# Patient Record
Sex: Male | Born: 2000 | Race: White | Hispanic: No | Marital: Single | Smoking: Never smoker
Health system: Southern US, Community
[De-identification: ages and names within clinical notes are randomized; demographics above are authoritative.]

## PROBLEM LIST (undated history)

## (undated) DIAGNOSIS — R12 Heartburn: Secondary | ICD-10-CM

## (undated) DIAGNOSIS — J45990 Exercise induced bronchospasm: Secondary | ICD-10-CM

## (undated) DIAGNOSIS — E781 Pure hyperglyceridemia: Secondary | ICD-10-CM

## (undated) DIAGNOSIS — I10 Essential (primary) hypertension: Secondary | ICD-10-CM

## (undated) HISTORY — DX: Pure hyperglyceridemia: E78.1

## (undated) HISTORY — DX: Exercise induced bronchospasm: J45.990

## (undated) HISTORY — DX: Heartburn: R12

---
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
## 2021-01-02 ENCOUNTER — Other Ambulatory Visit: Payer: Self-pay | Admitting: Family Medicine

## 2021-01-02 DIAGNOSIS — F418 Other specified anxiety disorders: Secondary | ICD-10-CM

## 2021-01-02 NOTE — Telephone Encounter (Signed)
Requested Prescriptions  Pending Prescriptions Disp Refills  . sertraline (ZOLOFT) 50 MG tablet [Pharmacy Med Name: Sertraline HCl 50 MG Oral Tablet] 90 tablet 1    Sig: TAKE 1 TABLET BY MOUTH  DAILY     Psychiatry:  Antidepressants - SSRI Passed - 01/02/2021  9:27 PM      Passed - Completed PHQ-2 or PHQ-9 in the last 360 days      Passed - Valid encounter within last 6 months    Recent Outpatient Visits          1 month ago Acute frontal sinusitis, recurrence not specified   [facility] Malva Limes, MD   10 months ago Depression with anxiety   [facility] Malva Limes, MD   10 months ago Right acute serous otitis media, recurrence not specified   [facility] Osvaldo Angst M, PA-C   10 months ago Acute otitis externa of left ear, unspecified type   [facility] [place], [place], [place]   2 years ago Cough   [facility] Malva Limes, MD

## 2021-02-10 ENCOUNTER — Ambulatory Visit: Payer: Self-pay | Admitting: *Deleted

## 2021-02-10 DIAGNOSIS — U071 COVID-19: Secondary | ICD-10-CM

## 2021-02-10 NOTE — Telephone Encounter (Signed)
Referral placed to covid treatment clinic

## 2021-02-10 NOTE — Telephone Encounter (Signed)
Can you find out if and when he received covid vaccines?

## 2021-02-10 NOTE — Telephone Encounter (Signed)
He has not been vaccinated per [facility].

## 2021-02-10 NOTE — Telephone Encounter (Signed)
I returned call to mother Beatrice Sehgal she is on his DPR.   His covid home test is positive and she was wanting to know what are the next steps as far as quarantine, etc.   She reports he has symptoms of fever, coughing, very sore throat, headache, fatigue, and body aches.  Criteria for self-isolation if you test positive for COVID-19, regardless of vaccination status:  -If you have mild symptoms that are resolving or have resolved, isolate at home for 5 days since symptoms started AND continue to wear a well-fitted mask when around others in the home and in public for 5 additional days after isolation is completed -If you have a fever and/or moderate to severe symptoms, isolate for at least 10 days since the symptoms started AND until you are fever free for at least 24 hours without the use of fever-reducing medications -If you tested positive and did not have symptoms, isolate for at least 5 days after your positive test  Use over-the-counter medications for symptoms.If you develop respiratory issues/distress, seek medical care in the Emergency Department.  If you must leave home or if you have to be around others please wear a mask. Please limit contact with immediate family members in the home, practice social distancing, frequent handwashing and clean hard surfaces touched frequently with household cleaning products. Members of your household will also need to quarantine and test.You may also be contacted by the health department for follow up. [facility] Department notified.   I answered her questions.  I send my notes to [facility] so Dr. Caryn Section would be aware in case treatment is indicated due to his risk factor.   Reason for Disposition . [1] [id number] diagnosed by positive lab test (e.g., PCR, rapid self-test kit) AND [2] mild symptoms (e.g., cough, fever, others) AND [8] no complications or SOB  Answer Assessment - Initial Assessment Questions 1. COVID-19  DIAGNOSIS: "Who made your COVID-19 diagnosis?" "Was it confirmed by a positive lab test or self-test?" If not diagnosed by a doctor (or NP/PA), ask "Are there lots of cases (community spread) where you live?" Note: See public health department website, if unsure.     Home test positive 2. COVID-19 EXPOSURE: "Was there any known exposure to COVID before the symptoms began?" CDC Definition of close contact: within 6 feet (2 meters) for a total of 15 minutes or more over a 24-hour period.      Sat. At 2:00 AM woke up sick with most of the symptoms 3. ONSET: "When did the COVID-19 symptoms start?"      Sat. At 2:00 AM he woke up sick 4. WORST SYMPTOM: "What is your worst symptom?" (e.g., cough, fever, shortness of breath, muscle aches)     Fever   Using Tylenol which is helping 5. COUGH: "Do you have a cough?" If Yes, ask: "How bad is the cough?"       Yes 6. FEVER: "Do you have a fever?" If Yes, ask: "What is your temperature, how was it measured, and when did it start?"     Yes 102.5 but now down to 100 with Tylenol 7. RESPIRATORY STATUS: "Describe your breathing?" (e.g., shortness of breath, wheezing, unable to speak)      Keeping a close eye on his breathing since he is "a big boy" per his mother Tabatha. 8. BETTER-SAME-WORSE: "Are you getting better, staying the same or getting worse compared to yesterday?"  If getting worse, ask, "In what way?"  Getting better since Sat. 9. HIGH RISK DISEASE: "Do you have any chronic medical problems?" (e.g., asthma, heart or lung disease, weak immune system, obesity, etc.)     "He is a big boy" 10. VACCINE: "Have you had the COVID-19 vaccine?" If Yes, ask: "Which one, how many shots, when did you get it?"       No vaccine 11. BOOSTER: "Have you received your COVID-19 booster?" If Yes, ask: "Which one and when did you get it?"       No 12. PREGNANCY: "Is there any chance you are pregnant?" "When was your last menstrual period?"       N/A 13. OTHER  SYMPTOMS: "Do you have any other symptoms?"  (e.g., chills, fatigue, headache, loss of smell or taste, muscle pain, sore throat)       Fatigue, headache, body aches, coughing, sore throat 14. O2 SATURATION MONITOR:  "Do you use an oxygen saturation monitor (pulse oximeter) at home?" If Yes, ask "What is your reading (oxygen level) today?" "What is your usual oxygen saturation reading?" (e.g., 95%)       No  Protocols used: CORONAVIRUS (COVID-19) DIAGNOSED OR SUSPECTED-A-AH

## 2021-02-10 NOTE — Addendum Note (Signed)
Addended by: Malva Limes on: 02/10/2021 04:55 PM   Modules accepted: Orders

## 2021-02-11 ENCOUNTER — Encounter: Payer: Self-pay | Admitting: Adult Health

## 2021-02-11 ENCOUNTER — Telehealth: Payer: Self-pay | Admitting: Adult Health

## 2021-02-11 NOTE — Telephone Encounter (Signed)
Called to discuss with patient about COVID-19 symptoms and the use of one of the available treatments for those with mild to moderate Covid symptoms and at a high risk of hospitalization.  Pt appears to qualify for outpatient treatment due to co-morbid conditions and/or a member of an at-risk group in accordance with the FDA Emergency Use Authorization.    Symptom onset: 02/08/2021 Vaccinated: no Booster? no Immunocompromised? no Qualifiers: BMI, obesity, chronic lung disease NIH Criteria: 1  Would be candidate for Bebtelovimab given comorbidities and being unvaccinated.  Unable to reach pt - LMOM, my chart message sent   Noreene Filbert

## 2021-03-24 ENCOUNTER — Other Ambulatory Visit: Payer: Self-pay | Admitting: Family Medicine

## 2021-03-24 DIAGNOSIS — I10 Essential (primary) hypertension: Secondary | ICD-10-CM

## 2021-03-24 DIAGNOSIS — K219 Gastro-esophageal reflux disease without esophagitis: Secondary | ICD-10-CM

## 2021-03-25 NOTE — Telephone Encounter (Signed)
Have sent 90 day refill to his pharmacy, but he is due for follow up office visit and needs to schedule in the next month or two.

## 2021-10-23 ENCOUNTER — Other Ambulatory Visit: Payer: Self-pay | Admitting: Family Medicine

## 2021-10-23 DIAGNOSIS — I10 Essential (primary) hypertension: Secondary | ICD-10-CM

## 2021-10-23 DIAGNOSIS — K219 Gastro-esophageal reflux disease without esophagitis: Secondary | ICD-10-CM

## 2021-10-23 DIAGNOSIS — F418 Other specified anxiety disorders: Secondary | ICD-10-CM

## 2021-10-23 MED ORDER — SERTRALINE HCL 50 MG PO TABS: 50.0000 mg | ORAL_TABLET | Freq: Every day | ORAL | 0 refills | Status: DC

## 2021-10-23 MED ORDER — AMLODIPINE BESYLATE 10 MG PO TABS: 10.0000 mg | ORAL_TABLET | Freq: Every day | ORAL | 0 refills | Status: DC

## 2021-10-23 NOTE — Telephone Encounter (Signed)
Already had curtesy refill, NO SHOW 09/11/20, no upcoming appt scheduled. Requested Prescriptions  Pending Prescriptions Disp Refills   omeprazole (PRILOSEC) 20 MG capsule [Pharmacy Med Name: Omeprazole 20 MG Oral Capsule Delayed Release] 90 capsule 3    Sig: TAKE 1 CAPSULE BY MOUTH  DAILY     Gastroenterology: Proton Pump Inhibitors Passed - 10/23/2021  4:43 AM      Passed - Valid encounter within last 12 months    Recent Outpatient Visits          11 months ago Acute frontal sinusitis, recurrence not specified   [facility] Malva Limes, MD   1 year ago Depression with anxiety   [facility] Malva Limes, MD   1 year ago Right acute serous otitis media, recurrence not specified   [facility] [place], Adriana M, PA-C   1 year ago Acute otitis externa of left ear, unspecified type   [facility] Osvaldo Angst M, [place]   3 years ago Cough   [facility] Malva Limes, MD              amLODipine (NORVASC) 10 MG tablet [Pharmacy Med Name: amLODIPine Besylate 10 MG Oral Tablet] 90 tablet 3    Sig: TAKE 1 TABLET BY MOUTH  DAILY     Cardiovascular:  Calcium Channel Blockers Failed - 10/23/2021  4:43 AM      Failed - Valid encounter within last 6 months    Recent Outpatient Visits          11 months ago Acute frontal sinusitis, recurrence not specified   [facility] Malva Limes, MD   1 year ago Depression with anxiety   [facility] Malva Limes, MD   1 year ago Right acute serous otitis media, recurrence not specified   [facility] [place], Adriana M, PA-C   1 year ago Acute otitis externa of left ear, unspecified type   [facility] Osvaldo Angst M, [place]   3 years ago Cough   [facility] Malva Limes, MD             Passed - Last BP in normal range    BP Readings from Last 1 Encounters:  03/05/20 139/82           sertraline (ZOLOFT) 50 MG tablet [Pharmacy Med Name: Sertraline HCl 50 MG Oral Tablet] 90 tablet 3    Sig: TAKE 1 TABLET BY MOUTH  DAILY     Psychiatry:  Antidepressants - SSRI Failed - 10/23/2021  4:43 AM      Failed - Completed PHQ-2 or PHQ-9 in the last 360 days      Failed - Valid encounter within last 6 months    Recent Outpatient Visits          11 months ago Acute frontal sinusitis, recurrence not specified   [facility] Malva Limes, MD   1 year ago Depression with anxiety   [facility] Malva Limes, MD   1 year ago Right acute serous otitis media, recurrence not specified   [facility] [place], Lavella Hammock, PA-C   1 year ago Acute otitis externa of left ear, unspecified type   [facility], [place], [place]   3 years ago Cough   [facility] Malva Limes, MD

## 2021-10-27 ENCOUNTER — Encounter: Payer: Self-pay | Admitting: Physician Assistant

## 2021-10-27 ENCOUNTER — Ambulatory Visit: Payer: Managed Care, Other (non HMO) | Admitting: Physician Assistant

## 2021-10-27 ENCOUNTER — Other Ambulatory Visit: Payer: Self-pay

## 2021-10-27 VITALS — BP 163/93 | HR 94 | Ht 72.0 in | Wt >= 6400 oz

## 2021-10-27 DIAGNOSIS — J029 Acute pharyngitis, unspecified: Secondary | ICD-10-CM | POA: Diagnosis not present

## 2021-10-27 DIAGNOSIS — R0981 Nasal congestion: Secondary | ICD-10-CM | POA: Diagnosis not present

## 2021-10-27 DIAGNOSIS — J0110 Acute frontal sinusitis, unspecified: Secondary | ICD-10-CM | POA: Diagnosis not present

## 2021-10-27 DIAGNOSIS — H66002 Acute suppurative otitis media without spontaneous rupture of ear drum, left ear: Secondary | ICD-10-CM

## 2021-10-27 LAB — POCT RAPID STREP A (OFFICE): Rapid Strep A Screen: NEGATIVE

## 2021-10-27 MED ORDER — AZELASTINE HCL 0.1 % NA SOLN: 2.0000 | Freq: Two times a day (BID) | NASAL | 2 refills | Status: AC

## 2021-10-27 MED ORDER — AMOXICILLIN-POT CLAVULANATE 875-125 MG PO TABS: 1.0000 | ORAL_TABLET | Freq: Two times a day (BID) | ORAL | 0 refills | Status: AC

## 2021-10-27 NOTE — Progress Notes (Signed)
I,Kevin Calderon,acting as a Neurosurgeon for [employer], PA-C.,have documented all relevant documentation on the behalf of Kevin Ferguson, PA-C,as directed by  Kevin Ferguson, PA-C while in the presence of Kevin Ferguson, PA-C.  Established patient visit   Patient: Kevin Calderon   DOB: 2000-11-26   20 y.o. Male  MRN: [id number] Visit Date: 10/27/2021  Today's healthcare provider: Alfredia Ferguson, PA-C   Cc. Sore throat, nasal congestion  Subjective    HPI  Kevin Calderon is a 21 y/o male who presents today with a sore throat, runny nose, PND, left ear decreased hearing since Friday. Reports negative COVID test this morning. Denies chest pain, SOB.  H/o tonsil and adenoidectomy    Medications: Outpatient Medications Prior to Visit  Medication Sig   albuterol (PROVENTIL HFA;VENTOLIN HFA) 108 (90 BASE) MCG/ACT inhaler Inhale 2 puffs into the lungs as needed.   amLODipine (NORVASC) 10 MG tablet Take 1 tablet (10 mg total) by mouth daily.   omeprazole (PRILOSEC) 20 MG capsule TAKE 1 CAPSULE BY MOUTH  DAILY   sertraline (ZOLOFT) 50 MG tablet Take 1 tablet (50 mg total) by mouth daily.   No facility-administered medications prior to visit.    Review of Systems  Constitutional:  Positive for fatigue. Negative for fever.  HENT:  Positive for congestion, hearing loss, postnasal drip, rhinorrhea, sinus pressure and sore throat.   Respiratory:  Positive for cough. Negative for shortness of breath.   Cardiovascular:  Negative for chest pain and leg swelling.  Neurological:  Negative for dizziness.      Objective    Blood pressure (!) 163/93, pulse 94, height 6' (1.829 m), weight (!) 417 lb 9.6 oz (189.4 kg), SpO2 99 %.   Physical Exam Constitutional:      General: He is awake.     Appearance: He is well-developed.  HENT:     Head: Normocephalic.     Right Ear: Tympanic membrane normal.     Left Ear: Tympanic membrane is erythematous and bulging.     Nose: Congestion and  rhinorrhea present.     Mouth/Throat:     Pharynx: Posterior oropharyngeal erythema present.  Eyes:     Conjunctiva/sclera: Conjunctivae normal.  Cardiovascular:     Rate and Rhythm: Normal rate.  Pulmonary:     Effort: Pulmonary effort is normal.  Skin:    General: Skin is warm.  Neurological:     Mental Status: He is alert and oriented to person, place, and time.  Psychiatric:        Attention and Perception: Attention normal.        Mood and Affect: Mood normal.        Speech: Speech normal.        Behavior: Behavior is cooperative.    Results for orders placed or performed in visit on 10/27/21  POCT rapid strep A  Result Value Ref Range   Rapid Strep A Screen Negative Negative    Assessment & Plan     Otitis Media, left ear Rx augmentin Advised azelastine spray, saline nasal spray Advised dayquil/nyquil  Monitor for fevers  2. Acute sinusitis, viral Strep negative See above   Return if symptoms worsen or fail to improve.      I, Kevin Ferguson, PA-C have reviewed all documentation for this visit. The documentation on  10/27/2021 for the exam, diagnosis, procedures, and orders are all accurate and complete.    Kevin Ferguson, PA-C  [facility] [phone] (phone) [phone] (fax)  [facility] ° °

## 2022-03-16 ENCOUNTER — Encounter: Payer: Self-pay | Admitting: Physician Assistant

## 2022-06-29 ENCOUNTER — Ambulatory Visit
Admission: EM | Admit: 2022-06-29 | Discharge: 2022-06-29 | Disposition: A | Discharge status: Home / Self Care | Payer: Managed Care, Other (non HMO) | Attending: Emergency Medicine | Admitting: Emergency Medicine

## 2022-06-29 DIAGNOSIS — R509 Fever, unspecified: Secondary | ICD-10-CM | POA: Insufficient documentation

## 2022-06-29 DIAGNOSIS — I10 Essential (primary) hypertension: Secondary | ICD-10-CM | POA: Insufficient documentation

## 2022-06-29 DIAGNOSIS — J45901 Unspecified asthma with (acute) exacerbation: Secondary | ICD-10-CM | POA: Insufficient documentation

## 2022-06-29 DIAGNOSIS — Z20822 Contact with and (suspected) exposure to covid-19: Secondary | ICD-10-CM | POA: Insufficient documentation

## 2022-06-29 LAB — RESP PANEL BY RT-PCR (FLU A&B, COVID) ARPGX2
Influenza A by PCR: NEGATIVE
Influenza B by PCR: NEGATIVE
SARS Coronavirus 2 by RT PCR: NEGATIVE

## 2022-06-29 MED ORDER — PREDNISONE 10 MG PO TABS: 40.0000 mg | ORAL_TABLET | Freq: Every day | ORAL | 0 refills | Status: AC

## 2022-06-29 MED ORDER — ALBUTEROL SULFATE HFA 108 (90 BASE) MCG/ACT IN AERS
1.0000 | INHALATION_SPRAY | Freq: Four times a day (QID) | RESPIRATORY_TRACT | 0 refills | Status: AC | PRN
Start: 2022-06-29 — End: ?

## 2022-06-29 NOTE — ED Triage Notes (Signed)
Patient to Urgent Care with complaints of  nasal congestion/ chest congestion, headache, and productive cough (clear/ yellow/ brown). Reports symptoms started two days ago. Reports some possible fevers.   Has been taking mucinex severe cold with tylenol.

## 2022-06-29 NOTE — Discharge Instructions (Addendum)
Take the prednisone and use the albuterol inhaler as directed.  Follow up with your primary care provider.    Go to the emergency department if you have shortness of breath or other concerning symptoms.   Your COVID and Flu tests are pending.   Your blood pressure is elevated today at 177/92; recheck 148/88.  Please have this rechecked by your primary care provider in 1-2 weeks.

## 2022-06-29 NOTE — ED Provider Notes (Signed)
[facility] Kevin Calderon    CSN: [id number] Arrival date & time: 06/29/22  1248      History   Chief Complaint Chief Complaint  Patient presents with   Nasal Congestion    HPI Kevin Calderon is a 21 y.o. male.  Accompanied by his mother, patient presents with 2-day history of congestion and cough.  His cough is productive of yellow sputum and he has mild shortness of breath when coughing.  He also felt like he may have had a fever yesterday evening; he did not take his temperature.  He also reports left ear pain.  No sore throat, rash, chest pain, vomiting, diarrhea, or other symptoms.  Treatment at home with OTC cold medication; none taken today.  He does not currently have an albuterol inhaler.  His medical history includes hypertension, asthma, OSA, depression, anxiety.    The history is provided by the patient, a parent and medical records.    Past Medical History:  Diagnosis Date   Exercise-induced asthma    Heartburn    Hypertension    Hypertriglyceridemia     Patient Active Problem List   Diagnosis Date Noted   Fever, unspecified 06/29/2022   OSA (obstructive sleep apnea) 03/20/2020   Hyperglycemia 03/06/2020   Hypertension 05/03/2018   Depression with anxiety 03/16/2018   Cellulitis 02/18/2015   Exercise-induced asthma 02/18/2015   Heartburn 02/18/2015   Obesity 02/18/2015   Hypertriglyceridemia 08/03/2013    Past Surgical History:  Procedure Laterality Date   TONSILLECTOMY AND ADENOIDECTOMY  2012       Home Medications    Prior to Admission medications   Medication Sig Start Date End Date Taking? Authorizing Provider  albuterol (VENTOLIN HFA) 108 (90 Base) MCG/ACT inhaler Inhale 1-2 puffs into the lungs every 6 (six) hours as needed. 06/29/22  Yes Sharion Balloon, NP  predniSONE (DELTASONE) 10 MG tablet Take 4 tablets (40 mg total) by mouth daily for 5 days. 06/29/22 07/04/22 Yes Sharion Balloon, NP  amLODipine (NORVASC) 10 MG tablet Take 1 tablet  (10 mg total) by mouth daily. 10/23/21   Birdie Sons, MD  azelastine (ASTELIN) 0.1 % nasal spray Place 2 sprays into both nostrils 2 (two) times daily. Use in each nostril as directed 10/27/21   Drubel, Ria Comment, PA-C  omeprazole (PRILOSEC) 20 MG capsule TAKE 1 CAPSULE BY MOUTH  DAILY 03/25/21   Birdie Sons, MD  sertraline (ZOLOFT) 50 MG tablet Take 1 tablet (50 mg total) by mouth daily. 10/23/21   Birdie Sons, MD    Family History Family History  Problem Relation Age of Onset   Asthma Mother    Esophageal cancer Father    Diabetes Father        type 2   Hypertension Father    COPD Maternal Grandfather    Lymphoma Paternal Grandmother     Social History Social History   Tobacco Use   Smoking status: Never   Smokeless tobacco: Never  Vaping Use   Vaping Use: Never used  Substance Use Topics   Alcohol use: No    Alcohol/week: 0.0 standard drinks of alcohol   Drug use: No     Allergies   Patient has no known allergies.   Review of Systems Review of Systems  Constitutional:  Positive for fever. Negative for chills.  HENT:  Positive for congestion and ear pain. Negative for sore throat.   Respiratory:  Positive for cough and shortness of breath.   Cardiovascular:  Negative for chest pain and palpitations.  Gastrointestinal:  Negative for diarrhea and vomiting.  Skin:  Negative for color change and rash.  All other systems reviewed and are negative.    Physical Exam Triage Vital Signs ED Triage Vitals  Enc Vitals Group     BP 06/29/22 1310 (!) 177/92     Pulse Rate 06/29/22 1310 80     Resp 06/29/22 1310 18     Temp 06/29/22 1310 98.5 F (36.9 C)     Temp src --      SpO2 06/29/22 1310 97 %     Weight --      Height 06/29/22 1313 6\' 1"  (1.854 m)     Head Circumference --      Peak Flow --      Pain Score 06/29/22 1313 0     Pain Loc --      Pain Edu? --      Excl. in [place]? --    No data found.  Updated Vital Signs BP (!) 148/88   Pulse 80    Temp 98.5 F (36.9 C)   Resp 18   Ht 6\' 1"  (1.854 m)   SpO2 97%   BMI 55.10 kg/m   Visual Acuity Right Eye Distance:   Left Eye Distance:   Bilateral Distance:    Right Eye Near:   Left Eye Near:    Bilateral Near:     Physical Exam Vitals and nursing note reviewed.  Constitutional:      General: He is not in acute distress.    Appearance: He is well-developed. He is obese. He is not ill-appearing.  HENT:     Right Ear: Tympanic membrane normal.     Left Ear: Tympanic membrane normal.     Nose: Nose normal.     Mouth/Throat:     Mouth: Mucous membranes are moist.     Pharynx: Oropharynx is clear.  Cardiovascular:     Rate and Rhythm: Normal rate and regular rhythm.     Heart sounds: Normal heart sounds.  Pulmonary:     Effort: Pulmonary effort is normal. No respiratory distress.     Breath sounds: Wheezing present.     Comments: Scattered few bilateral expiratory wheezes. Musculoskeletal:     Cervical back: Neck supple.  Skin:    General: Skin is warm and dry.  Neurological:     Mental Status: He is alert.  Psychiatric:        Mood and Affect: Mood normal.        Behavior: Behavior normal.      UC Treatments / Results  Labs (all labs ordered are listed, but only abnormal results are displayed) Labs Reviewed  RESP PANEL BY RT-PCR (FLU A&B, COVID) ARPGX2    EKG   Radiology No results found.  Procedures Procedures (including critical care time)  Medications Ordered in UC Medications - No data to display  Initial Impression / Assessment and Plan / UC Course  I have reviewed the triage vital signs and the nursing notes.  Pertinent labs & imaging results that were available during my care of the patient were reviewed by me and considered in my medical decision making (see chart for details).   Fever, asthma exacerbation, elevated blood pressure with hypertension.  COVID and flu pending.  Patient has mild expiratory wheezing.  No acute distress.  O2  sat 97% on room air.  Treating with albuterol inhaler and prednisone.  Instructed patient to follow-up  with his PCP.  ED precautions discussed.  Education provided on asthma.  Also discussed with patient that his blood pressure is elevated today and needs to be rechecked by PCP in 1-2 weeks.  Education provided on managing hypertension.  He agrees to plan of care.     Final Clinical Impressions(s) / UC Diagnoses   Final diagnoses:  Fever, unspecified  Asthma with acute exacerbation, unspecified asthma severity, unspecified whether persistent  Elevated blood pressure reading in office with diagnosis of hypertension     Discharge Instructions      Take the prednisone and use the albuterol inhaler as directed.  Follow up with your primary care provider.    Go to the emergency department if you have shortness of breath or other concerning symptoms.   Your COVID and Flu tests are pending.   Your blood pressure is elevated today at 177/92; recheck 148/88.  Please have this rechecked by your primary care provider in 1-2 weeks.          ED Prescriptions     Medication Sig Dispense Auth. Provider   albuterol (VENTOLIN HFA) 108 (90 Base) MCG/ACT inhaler Inhale 1-2 puffs into the lungs every 6 (six) hours as needed. 18 g Mickie Bail, NP   predniSONE (DELTASONE) 10 MG tablet Take 4 tablets (40 mg total) by mouth daily for 5 days. 20 tablet Mickie Bail, NP      PDMP not reviewed this encounter.   Mickie Bail, NP 06/29/22 1357

## 2022-10-02 ENCOUNTER — Ambulatory Visit
Admission: EM | Admit: 2022-10-02 | Discharge: 2022-10-02 | Disposition: A | Discharge status: Home / Self Care | Payer: Managed Care, Other (non HMO) | Attending: Urgent Care | Admitting: Urgent Care

## 2022-10-02 DIAGNOSIS — R6889 Other general symptoms and signs: Secondary | ICD-10-CM | POA: Diagnosis not present

## 2022-10-02 DIAGNOSIS — U071 COVID-19: Secondary | ICD-10-CM | POA: Diagnosis not present

## 2022-10-02 MED ORDER — OSELTAMIVIR PHOSPHATE 75 MG PO CAPS: 75.0000 mg | ORAL_CAPSULE | Freq: Two times a day (BID) | ORAL | 0 refills | Status: AC

## 2022-10-02 NOTE — ED Triage Notes (Signed)
Pt.presents to UC w/ c/o a fever, cough, diarrhea and a headache since yesterday.

## 2022-10-02 NOTE — Discharge Instructions (Signed)
You have been diagnosed with a viral upper respiratory infection based on your symptoms and exam. Viral illnesses cannot be treated with antibiotics - they are self limiting - and you should find your symptoms resolving within a few days. Get plenty of rest and non-caffeinated fluids. Watch for signs of dehydration including reduced urine output and dark colored urine.  We have performed a respiratory swab testing for COVID. I have prescribed Tamiflu, antiviral therapy for influenza A, based on a presumptive diagnosis of influenza.  If the results of your COVID test is positive, someone will contact you to recommend stopping Tamiflu.  We recommend you use over-the-counter medications for symptom control including acetaminophen (Tylenol), ibuprofen (Advil/Motrin) or naproxen (Aleve) for fever, chills or body aches. You may combine use of acetaminophen and ibuprofen/naproxen if needed. Also recommend  cold/cough medication such as dextromethorphan, but please note that this medication is not recommended if you suffer from hypertension.    Saline mist spray is helpful for removing excess mucus from your nose.  Room humidifiers are helpful to ease breathing at night. I recommend guaifenesin (Mucinex) with plenty of water throughout the day to help thin and loosen mucus secretions in your respiratory passages.   If appropriate based upon your other medical problems, you might also find relief of nasal/sinus congestion symptoms by using a nasal decongestant such as Flonase (fluticasone) or Sudafed sinus (pseudoephedrine).  You will need to obtain Sudafed from behind the pharmacist counter.  Speak to the pharmacist to verify that you are not duplicating medications with other over-the-counter formulations that you may be using.

## 2022-10-02 NOTE — ED Provider Notes (Signed)
Rod[facility] Palau    CSN: [id number] Arrival date & time: 10/02/22  1353      History   Chief Complaint Chief Complaint  Patient presents with   Fever    Flu like/ Covid like symptoms - Entered by patient   Cough   Diarrhea    HPI Kevin Calderon is a 22 y.o. male.    Fever Associated symptoms: cough and diarrhea   Cough Associated symptoms: fever   Diarrhea Associated symptoms: fever     Patient presents to urgent care with complaint of fever, cough, diarrhea, headache starting yesterday.  He endorses no documented fever.  Reports cough is sometimes productive. 3 Episodes of diarrhea today. No n/v.  Denies SOB or dyspnea.  Past Medical History:  Diagnosis Date   Exercise-induced asthma    Heartburn    Hypertension    Hypertriglyceridemia     Patient Active Problem List   Diagnosis Date Noted   Fever, unspecified 06/29/2022   OSA (obstructive sleep apnea) 03/20/2020   Hyperglycemia 03/06/2020   Hypertension 05/03/2018   Depression with anxiety 03/16/2018   Cellulitis 02/18/2015   Exercise-induced asthma 02/18/2015   Heartburn 02/18/2015   Obesity 02/18/2015   Hypertriglyceridemia 08/03/2013    Past Surgical History:  Procedure Laterality Date   TONSILLECTOMY AND ADENOIDECTOMY  2012       Home Medications    Prior to Admission medications   Medication Sig Start Date End Date Taking? Authorizing Provider  albuterol (VENTOLIN HFA) 108 (90 Base) MCG/ACT inhaler Inhale 1-2 puffs into the lungs every 6 (six) hours as needed. 06/29/22   Sharion Balloon, NP  amLODipine (NORVASC) 10 MG tablet Take 1 tablet (10 mg total) by mouth daily. 10/23/21   Birdie Sons, MD  azelastine (ASTELIN) 0.1 % nasal spray Place 2 sprays into both nostrils 2 (two) times daily. Use in each nostril as directed 10/27/21   Drubel, Ria Comment, PA-C  meloxicam (MOBIC) 7.5 MG tablet Take 1 tablet by mouth 2 (two) times daily.    [provider]  omeprazole  (PRILOSEC) 20 MG capsule TAKE 1 CAPSULE BY MOUTH  DAILY 03/25/21   Birdie Sons, MD  sertraline (ZOLOFT) 50 MG tablet Take 1 tablet (50 mg total) by mouth daily. 10/23/21   Birdie Sons, MD    Family History Family History  Problem Relation Age of Onset   Asthma Mother    Esophageal cancer Father    Diabetes Father        type 2   Hypertension Father    COPD Maternal Grandfather    Lymphoma Paternal Grandmother     Social History Social History   Tobacco Use   Smoking status: Never   Smokeless tobacco: Never  Vaping Use   Vaping Use: Never used  Substance Use Topics   Alcohol use: No    Alcohol/week: 0.0 standard drinks of alcohol   Drug use: No     Allergies   Patient has no known allergies.   Review of Systems Review of Systems  Constitutional:  Positive for fever.  Respiratory:  Positive for cough.   Gastrointestinal:  Positive for diarrhea.     Physical Exam Triage Vital Signs ED Triage Vitals [10/02/22 1435]  Enc Vitals Group     BP (!) 169/103     Pulse Rate 90     Resp 17     Temp 98.5 F (36.9 C)     Temp src  SpO2 96 %     Weight      Height      Head Circumference      Peak Flow      Pain Score      Pain Loc      Pain Edu?      Excl. in [place]?    No data found.  Updated Vital Signs BP (!) 169/103   Pulse 90   Temp 98.5 F (36.9 C)   Resp 17   SpO2 96%   Visual Acuity Right Eye Distance:   Left Eye Distance:   Bilateral Distance:    Right Eye Near:   Left Eye Near:    Bilateral Near:     Physical Exam Vitals reviewed.  Constitutional:      Appearance: He is obese. He is ill-appearing.  Cardiovascular:     Rate and Rhythm: Normal rate and regular rhythm.     Pulses: Normal pulses.     Heart sounds: Normal heart sounds.  Pulmonary:     Effort: Pulmonary effort is normal.     Breath sounds: Normal breath sounds.  Skin:    General: Skin is warm and dry.  Neurological:     General: No focal deficit present.      Mental Status: He is alert and oriented to person, place, and time.  Psychiatric:        Mood and Affect: Mood normal.        Behavior: Behavior normal.      UC Treatments / Results  Labs (all labs ordered are listed, but only abnormal results are displayed) Labs Reviewed - No data to display  EKG   Radiology No results found.  Procedures Procedures (including critical care time)  Medications Ordered in UC Medications - No data to display  Initial Impression / Assessment and Plan / UC Course  I have reviewed the triage vital signs and the nursing notes.  Pertinent labs & imaging results that were available during my care of the patient were reviewed by me and considered in my medical decision making (see chart for details).   Patient is afebrile here without recent antipyretics. Satting well on room air. Overall is ill appearing, well hydrated, without respiratory distress. Pulmonary exam is unremarkable.  Lungs CTAB without wheezing, rhonchi, rales.  Symptoms are consistent with an acute viral process including flu and COVID.  COVID swab is pending.  Given he is within the treatment window for influenza will treat today with antiviral therapy.  Patient states he plans to go to pharmacy to request influenza test there and will use the medication if he is positive.  Otherwise recommending use of OTC medication for symptom control.  Would not necessarily recommend Paxlovid if COVID-positive as his symptoms appear relatively mild, denies shortness of breath or dyspnea, and his only risk factor for poor outcome is his large body habitus.  Final Clinical Impressions(s) / UC Diagnoses   Final diagnoses:  None   Discharge Instructions   None    ED Prescriptions   None    PDMP not reviewed this encounter.   Rose Phi, [place] 10/02/22 1450

## 2022-10-03 LAB — SARS CORONAVIRUS 2 (TAT 6-24 HRS): SARS Coronavirus 2: POSITIVE — AB

## 2022-11-30 ENCOUNTER — Ambulatory Visit (INDEPENDENT_AMBULATORY_CARE_PROVIDER_SITE_OTHER): Payer: Managed Care, Other (non HMO)

## 2022-11-30 ENCOUNTER — Ambulatory Visit: Payer: Managed Care, Other (non HMO) | Admitting: Podiatry

## 2022-11-30 ENCOUNTER — Encounter: Payer: Self-pay | Admitting: Podiatry

## 2022-11-30 DIAGNOSIS — M722 Plantar fascial fibromatosis: Secondary | ICD-10-CM

## 2022-11-30 DIAGNOSIS — M62462 Contracture of muscle, left lower leg: Secondary | ICD-10-CM

## 2022-11-30 DIAGNOSIS — M62461 Contracture of muscle, right lower leg: Secondary | ICD-10-CM

## 2022-11-30 DIAGNOSIS — M7662 Achilles tendinitis, left leg: Secondary | ICD-10-CM

## 2022-11-30 DIAGNOSIS — M7661 Achilles tendinitis, right leg: Secondary | ICD-10-CM | POA: Diagnosis not present

## 2022-11-30 NOTE — Patient Instructions (Signed)

## 2022-12-01 MED ORDER — MELOXICAM 15 MG PO TABS: 15.0000 mg | ORAL_TABLET | Freq: Every day | ORAL | 3 refills | Status: AC

## 2022-12-01 NOTE — Progress Notes (Signed)
  Subjective:  Patient ID: Kevin Calderon, male    DOB: 2001/06/29,  MRN: [id number]  Chief Complaint  Patient presents with   Foot Pain    "My heels hurt a lot." N - heel pain L - posterior b/l D - couple of years O - gradually worse C - sharp pain A - walking T - went to [facility], Ibuprofen, Voltaren Gel    22 y.o. male presents with the above complaint. History confirmed with patient.   Objective:  Physical Exam: warm, good capillary refill, no trophic changes or ulcerative lesions, normal DP and PT pulses, and normal sensory exam. Left Foot: tenderness at Achilles tendon insertion and gastrocnemius equinus is noted with a positive silverskiold test Right Foot: tenderness at Achilles tendon insertion and gastrocnemius equinus is noted with a positive silverskiold test  No images are attached to the encounter.  Radiographs: Multiple views x-ray of both feet: no fracture, dislocation, swelling or degenerative changes noted, posterior calcaneal spur, and Haglund deformity noted Assessment:   1. Achilles tendinitis of both lower extremities   2. Gastrocnemius equinus of left lower extremity   3. Gastrocnemius equinus of right lower extremity      Plan:  Patient was evaluated and treated and all questions answered.   Discussed the etiology and treatment options for Achilles tendinitis including stretching, formal physical therapy with an eccentric exercises therapy plan, supportive shoegears such as a running shoe or sneaker, heel lifts, topical and oral medications.  We also discussed that I do not routinely perform injections in this area because of the risk of an increased damage or rupture of the tendon.  We also discussed the role of surgical treatment of this for patients who do not improve after exhausting non-surgical treatment options.  -XR reviewed with patient -Educated on stretching and icing of the affected limb. -Referral placed to physical  therapy. -Rx for Meloxicam. Advised on risks, benefits, and alternatives of the medication -We discussed the impact of his weight and obesity on his tendinitis and the difficulty that will present in overcoming this.  Do not think he would be a good surgical candidate due to this  Return in about 6 weeks (around 01/11/2023) for re-check Achilles tendon.

## 2022-12-09 ENCOUNTER — Telehealth: Payer: Self-pay | Admitting: Podiatry

## 2022-12-09 NOTE — Telephone Encounter (Signed)
Left a detailed vm for the patient's mother to call the office back.

## 2022-12-09 NOTE — Telephone Encounter (Signed)
Mom called and wants referral for PT sent to [facility] instead of [facility].   Please advise

## 2022-12-09 NOTE — Telephone Encounter (Signed)
Referral was sent faxed over yesterday and was sent through successfully.

## 2022-12-10 ENCOUNTER — Telehealth: Payer: Self-pay | Admitting: *Deleted

## 2022-12-10 NOTE — Telephone Encounter (Signed)
Called [facility], stated that the patient was a no show for appointment on the 12 th, will not reach back out to patient , he will have to contact them to schedule, information given to patient, verbalized understanding.

## 2022-12-10 NOTE — Telephone Encounter (Deleted)
-----   Message from Lolita Rieger sent at 12/07/2022  6:21 PM EDT ----- Hello ladies, can one of you fax this for me?  We aren't able to fax at this time. ----- Message ----- From: Criselda Peaches, DPM Sent: 12/01/2022   6:56 AM EDT To: Lolita Rieger  I meant to fill out a [facility] referral yesterday, have attached an order here can you fax to them?  Thanks   [address] [place], [place] [address]  [email or web address]  Phone [phone]  Fax [phone]

## 2022-12-31 ENCOUNTER — Other Ambulatory Visit: Payer: Self-pay | Admitting: Family Medicine

## 2022-12-31 DIAGNOSIS — I10 Essential (primary) hypertension: Secondary | ICD-10-CM

## 2022-12-31 DIAGNOSIS — F418 Other specified anxiety disorders: Secondary | ICD-10-CM

## 2022-12-31 DIAGNOSIS — K219 Gastro-esophageal reflux disease without esophagitis: Secondary | ICD-10-CM

## 2023-01-11 ENCOUNTER — Ambulatory Visit: Payer: Self-pay

## 2023-01-11 ENCOUNTER — Ambulatory Visit: Payer: Managed Care, Other (non HMO) | Admitting: Podiatry

## 2023-01-11 NOTE — Telephone Encounter (Signed)
Chief Complaint: Hand injury, possible fracture Symptoms: Right hand swollen Frequency: Happened today, he was "goofing around" Pertinent Negatives: Patient denies other symptoms Disposition: [] ED /[x] Urgent Care (no appt availability in office) / [] Appointment(In office/virtual)/ []  [facility]/ [] Home Care/ [] Refused Recommended Disposition /[] [facility]/ []  Follow-up with PCP Additional Notes: Mom called and asks if she could bring him here or go to [facility] to have his hand checked out and xray done. Advised no available appointments today, go to [facility] for evaluation, she verbalized understanding.    Reason for Disposition  [1] SEVERE pain AND [2] not improved 2 hours after pain medicine/ice packs  Answer Assessment - Initial Assessment Questions 1. MECHANISM: "How did the injury happen?"     Unknown, said he was goofing around 2. ONSET: "When did the injury happen?" (Minutes or hours ago)      Today to the right hand 3. APPEARANCE of INJURY: "What does the injury look like?"      Swollen 4. SEVERITY: "Can you use the hand normally?" "Can you bend your fingers into a ball and then fully open them?"     No 5. SIZE: For cuts, bruises, or swelling, ask: "How large is it?" (e.g., inches or centimeters;  entire hand or wrist)      N/A 6. PAIN: "Is there pain?" If Yes, ask: "How bad is the pain?"  (Scale 1-10; or mild, moderate, severe)     8-9 with movement 7. TETANUS: For any breaks in the skin, ask: "When was the last tetanus booster?"     N/A 8. OTHER SYMPTOMS: "Do you have any other symptoms?"      No  Protocols used: Hand and Wrist Injury-A-AH

## 2023-01-20 ENCOUNTER — Telehealth: Payer: Self-pay

## 2023-01-20 DIAGNOSIS — K219 Gastro-esophageal reflux disease without esophagitis: Secondary | ICD-10-CM

## 2023-01-20 DIAGNOSIS — F418 Other specified anxiety disorders: Secondary | ICD-10-CM

## 2023-01-20 DIAGNOSIS — I10 Essential (primary) hypertension: Secondary | ICD-10-CM

## 2023-01-20 MED ORDER — OMEPRAZOLE 20 MG PO CPDR: 20.0000 mg | DELAYED_RELEASE_CAPSULE | Freq: Every day | ORAL | 0 refills | Status: DC

## 2023-01-20 MED ORDER — SERTRALINE HCL 50 MG PO TABS: 50.0000 mg | ORAL_TABLET | Freq: Every day | ORAL | 0 refills | Status: DC

## 2023-01-20 MED ORDER — AMLODIPINE BESYLATE 10 MG PO TABS: 10.0000 mg | ORAL_TABLET | Freq: Every day | ORAL | 0 refills | Status: DC

## 2023-01-20 NOTE — Telephone Encounter (Signed)
Lmtcb to schedule an appt with Dr. Sherrie Mustache.

## 2023-02-03 ENCOUNTER — Ambulatory Visit: Payer: Managed Care, Other (non HMO) | Admitting: Podiatry

## 2023-02-03 DIAGNOSIS — M62462 Contracture of muscle, left lower leg: Secondary | ICD-10-CM

## 2023-02-03 DIAGNOSIS — M7662 Achilles tendinitis, left leg: Secondary | ICD-10-CM

## 2023-02-03 DIAGNOSIS — M62461 Contracture of muscle, right lower leg: Secondary | ICD-10-CM

## 2023-02-03 DIAGNOSIS — M7661 Achilles tendinitis, right leg: Secondary | ICD-10-CM | POA: Diagnosis not present

## 2023-02-08 NOTE — Progress Notes (Signed)
  Subjective:  Patient ID: Kevin Calderon, male    DOB: 2000-11-23,  MRN: [id number]  Chief Complaint  Patient presents with   Tendonitis    6 week follow up achilles tendonitis bilateral    22 y.o. male presents with the above complaint. History confirmed with patient.  He is making progress and he is about 50% better  Objective:  Physical Exam: warm, good capillary refill, no trophic changes or ulcerative lesions, normal DP and PT pulses, and normal sensory exam..  Mild tenderness at insertion of left Achilles tendon, right Achilles less so, good 5 out of 5 strength, improvement in equinus    Radiographs: Multiple views x-ray of both feet: no fracture, dislocation, swelling or degenerative changes noted, posterior calcaneal spur, and Haglund deformity noted Assessment:   1. Achilles tendinitis of both lower extremities   2. Gastrocnemius equinus of left lower extremity   3. Gastrocnemius equinus of right lower extremity      Plan:  Patient was evaluated and treated and all questions answered.  Overall doing very well.  Has had about 4 weeks of physical therapy.  I discussed with him I would probably recommend 8 to 12 weeks total.  He will continue with physical therapy and home exercise plan.  Continue Mobic as needed.  I will see him back in July for a follow-up visit, sooner if there are issues.  Return in about 11 weeks (around 04/21/2023) for re-check Achilles tendon.

## 2023-02-09 ENCOUNTER — Telehealth: Payer: Self-pay | Admitting: Family Medicine

## 2023-02-09 DIAGNOSIS — K219 Gastro-esophageal reflux disease without esophagitis: Secondary | ICD-10-CM

## 2023-02-09 DIAGNOSIS — F418 Other specified anxiety disorders: Secondary | ICD-10-CM

## 2023-02-09 DIAGNOSIS — I10 Essential (primary) hypertension: Secondary | ICD-10-CM

## 2023-02-09 NOTE — Telephone Encounter (Signed)
Called patient, no answer left VM to call back to schedule OV.

## 2023-02-09 NOTE — Telephone Encounter (Signed)
LMTCB, [facility] Triage Nurse may give patient results/message   Patient needs appointment before the Omeprazole runs out.  Dr Sherrie Mustache will not refill until seen

## 2023-02-09 NOTE — Telephone Encounter (Signed)
Have sent 30 days supply. Please advise that patient has not been seen since 09/2021, needs to schedule o.v. before additional refills can be approved.

## 2023-02-26 ENCOUNTER — Telehealth: Payer: Self-pay

## 2023-02-26 NOTE — Telephone Encounter (Signed)
LVM. Sending mychart msg. AS, CMA 

## 2023-04-28 ENCOUNTER — Ambulatory Visit: Payer: Managed Care, Other (non HMO) | Admitting: Podiatry

## 2023-04-28 ENCOUNTER — Encounter: Payer: Self-pay | Admitting: Podiatry

## 2023-04-28 DIAGNOSIS — M7662 Achilles tendinitis, left leg: Secondary | ICD-10-CM

## 2023-04-28 DIAGNOSIS — M7661 Achilles tendinitis, right leg: Secondary | ICD-10-CM

## 2023-04-28 DIAGNOSIS — M62462 Contracture of muscle, left lower leg: Secondary | ICD-10-CM

## 2023-04-28 DIAGNOSIS — M62461 Contracture of muscle, right lower leg: Secondary | ICD-10-CM | POA: Diagnosis not present

## 2023-04-28 DIAGNOSIS — Z6841 Body Mass Index (BMI) 40.0 and over, adult: Secondary | ICD-10-CM

## 2023-04-28 NOTE — Progress Notes (Signed)
  Subjective:  Patient ID: Kevin Calderon, male    DOB: 2001-07-23,  MRN: [id number]  Chief Complaint  Patient presents with   Foot Pain    "They're still going through it but it's a little better."    22 y.o. male presents with the above complaint. History confirmed with patient.  He feels that its made it additional 30 to 40% progress, still some tenderness when he is on it for long time.  He has been working on losing some weight.  Objective:  Physical Exam: warm, good capillary refill, no trophic changes or ulcerative lesions, normal DP and PT pulses, and normal sensory exam..  Mild tenderness at insertion of left Achilles tendon, right Achilles less so, good 5 out of 5 strength, improvement in equinus    Radiographs: Multiple views x-ray of both feet: no fracture, dislocation, swelling or degenerative changes noted, posterior calcaneal spur, and Haglund deformity noted Assessment:   1. Achilles tendinitis of both lower extremities   2. Gastrocnemius equinus of left lower extremity   3. Gastrocnemius equinus of right lower extremity   4. Class 3 severe obesity due to excess calories with serious comorbidity and body mass index (BMI) of 50.0 to 59.9 in adult [facility])      Plan:  Patient was evaluated and treated and all questions answered.   Improved again but does still have pain.  I discussed with him that I do think his weight is making a large impact in delaying his recovery.  Has not had a medical treatment or consultation for weight loss.  I recommend consultation with a healthy weight and wellness clinic.  Referral was placed.  I expect most of this should resolve following this.  Long-term if it did not resolve there are surgical options but he would need to have a BMI of less than 40 prior to proceeding with this.  Hopefully can avoid it if he continues to improve with weight loss.  Follow-up with me as needed.  Return if symptoms worsen or fail to improve.

## 2023-04-29 ENCOUNTER — Encounter (INDEPENDENT_AMBULATORY_CARE_PROVIDER_SITE_OTHER): Payer: Self-pay

## 2023-11-24 ENCOUNTER — Encounter: Payer: Self-pay | Admitting: Family Medicine

## 2023-11-24 ENCOUNTER — Other Ambulatory Visit: Payer: Self-pay | Admitting: Family Medicine

## 2023-11-24 DIAGNOSIS — K219 Gastro-esophageal reflux disease without esophagitis: Secondary | ICD-10-CM

## 2023-11-24 DIAGNOSIS — F418 Other specified anxiety disorders: Secondary | ICD-10-CM

## 2023-11-24 DIAGNOSIS — I10 Essential (primary) hypertension: Secondary | ICD-10-CM

## 2023-11-25 MED ORDER — AMLODIPINE BESYLATE 10 MG PO TABS: 10.0000 mg | ORAL_TABLET | Freq: Every day | ORAL | 1 refills | Status: AC

## 2023-11-25 MED ORDER — OMEPRAZOLE 20 MG PO CPDR: 20.0000 mg | DELAYED_RELEASE_CAPSULE | Freq: Every day | ORAL | 0 refills | Status: AC

## 2023-11-25 MED ORDER — SERTRALINE HCL 50 MG PO TABS: 50.0000 mg | ORAL_TABLET | Freq: Every day | ORAL | 0 refills | Status: AC
